# Patient Record
Sex: Female | Born: 2000 | Race: Black or African American | Hispanic: No | Marital: Single | Smoking: Never smoker
Health system: Southern US, Community
[De-identification: ages and names within clinical notes are randomized; demographics above are authoritative.]

## PROBLEM LIST (undated history)

## (undated) DIAGNOSIS — J45909 Unspecified asthma, uncomplicated: Secondary | ICD-10-CM

---
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
## 2019-08-07 ENCOUNTER — Other Ambulatory Visit: Payer: Self-pay

## 2019-08-07 DIAGNOSIS — Z20822 Contact with and (suspected) exposure to covid-19: Secondary | ICD-10-CM

## 2019-08-09 LAB — NOVEL CORONAVIRUS, NAA: SARS-CoV-2, NAA: NOT DETECTED

## 2019-10-18 ENCOUNTER — Other Ambulatory Visit: Payer: Self-pay

## 2019-10-18 ENCOUNTER — Emergency Department (HOSPITAL_BASED_OUTPATIENT_CLINIC_OR_DEPARTMENT_OTHER)
Admission: EM | Admit: 2019-10-18 | Discharge: 2019-10-19 | Disposition: A | Discharge status: Home / Self Care | Payer: BLUE CROSS/BLUE SHIELD | Attending: Emergency Medicine | Admitting: Emergency Medicine

## 2019-10-18 ENCOUNTER — Encounter (HOSPITAL_BASED_OUTPATIENT_CLINIC_OR_DEPARTMENT_OTHER): Payer: Self-pay

## 2019-10-18 DIAGNOSIS — J45909 Unspecified asthma, uncomplicated: Secondary | ICD-10-CM | POA: Insufficient documentation

## 2019-10-18 DIAGNOSIS — R1030 Lower abdominal pain, unspecified: Secondary | ICD-10-CM | POA: Diagnosis present

## 2019-10-18 DIAGNOSIS — K529 Noninfective gastroenteritis and colitis, unspecified: Secondary | ICD-10-CM

## 2019-10-18 MED ORDER — SODIUM CHLORIDE 0.9 % IV BOLUS
1000.0000 mL | Freq: Once | INTRAVENOUS | Status: AC
  Administered 2019-10-19: 01:00:00 1000 mL via INTRAVENOUS

## 2019-10-18 NOTE — ED Provider Notes (Addendum)
[facility] Provider Note: Lowella Dell, MD, FACEP  CSN: [id number] MRN: [id number] ARRIVAL: 10/18/19 at 2230 ROOM: [facility]04/[facility]04   CHIEF COMPLAINT  Abdominal Pain   HISTORY OF PRESENT ILLNESS  10/18/19 11:40 PM Alyssa Bauer is a 19 y.o. female who developed diarrhea, nausea and vomiting about 4 AM today.  She states she had multiple episodes of vomiting.  The diarrhea was associated with lower abdominal cramping which she rates as an 8 out of 10.  The pain has been intermittent and is relieved by voiding her bowels.  She is also having pain in her lower back.  She describes this pain as aching and rates it a 7.  She has not had a fever.  She was noted to be tachycardic on arrival.   Past Medical History:  Diagnosis Date  . Asthma     History reviewed. No pertinent surgical history.  No family history on file.  Social History   Tobacco Use  . Smoking status: Never Smoker  . Smokeless tobacco: Never Used  Substance Use Topics  . Alcohol use: Not Currently  . Drug use: Never    Prior to Admission medications   Medication Sig Start Date End Date Taking? Authorizing Provider  albuterol (PROVENTIL HFA;VENTOLIN HFA) 108 (90 BASE) MCG/ACT inhaler Inhale 2 puffs into the lungs every 4 (four) hours as needed for wheezing or shortness of breath.    [provider]  ondansetron (ZOFRAN ODT) 8 MG disintegrating tablet Take 1 tablet (8 mg total) by mouth every 8 (eight) hours as needed for nausea or vomiting. 10/19/19   Quetzali Heinle, Jonny Ruiz, MD    Allergies Patient has no known allergies.   REVIEW OF SYSTEMS  Negative except as noted here or in the History of Present Illness.   PHYSICAL EXAMINATION  Initial Vital Signs Blood pressure 122/90, pulse (!) 108, temperature 98.3 F (36.8 C), temperature source Oral, resp. rate 18, height 5\' 5"  (1.651 m), weight 63 kg, SpO2 100 %.  Examination General: Well-developed, well-nourished female in no acute distress;  appearance consistent with age of record HENT: normocephalic; atraumatic Eyes: Normal appearance Neck: supple Heart: regular rate and rhythm Lungs: clear to auscultation bilaterally Abdomen: soft; nondistended; nontender; no masses or hepatosplenomegaly; bowel sounds present Extremities: No deformity; full range of motion; pulses normal Neurologic: Awake, alert and oriented; motor function intact in all extremities and symmetric; no facial droop Skin: Warm and dry Psychiatric: Normal mood and affect   RESULTS  Summary of this visit's results, reviewed and interpreted by myself:   EKG Interpretation  Date/Time:    Ventricular Rate:    PR Interval:    QRS Duration:   QT Interval:    QTC Calculation:   R Axis:     Text Interpretation:        Laboratory Studies: Results for orders placed or performed during the hospital encounter of 10/18/19 (from the past 24 hour(s))  Urinalysis, Routine w reflex microscopic     Status: Abnormal   Collection Time: 10/19/19 12:21 AM  Result Value Ref Range   Color, Urine YELLOW YELLOW   APPearance CLEAR CLEAR   Specific Gravity, Urine 1.015 1.005 - 1.030   pH 6.0 5.0 - 8.0   Glucose, UA NEGATIVE NEGATIVE mg/dL   Hgb urine dipstick LARGE (A) NEGATIVE   Bilirubin Urine NEGATIVE NEGATIVE   Ketones, ur 40 (A) NEGATIVE mg/dL   Protein, ur NEGATIVE NEGATIVE mg/dL   Nitrite NEGATIVE NEGATIVE   Leukocytes,Ua NEGATIVE NEGATIVE  Pregnancy, urine     Status: None   Collection Time: 10/19/19 12:21 AM  Result Value Ref Range   Preg Test, Ur NEGATIVE NEGATIVE  Urinalysis, Microscopic (reflex)     Status: Abnormal   Collection Time: 10/19/19 12:21 AM  Result Value Ref Range   RBC / HPF 11-20 0 - 5 RBC/hpf   WBC, UA 0-5 0 - 5 WBC/hpf   Bacteria, UA FEW (A) NONE SEEN   Squamous Epithelial / LPF 0-5 0 - 5   Mucus PRESENT   CBC with Differential/Platelet     Status: Abnormal   Collection Time: 10/19/19 12:30 AM  Result Value Ref Range   WBC 11.2  (H) 4.0 - 10.5 K/uL   RBC 4.87 3.87 - 5.11 MIL/uL   Hemoglobin 13.8 12.0 - 15.0 g/dL   HCT 42.0 36.0 - 46.0 %   MCV 86.2 80.0 - 100.0 fL   MCH 28.3 26.0 - 34.0 pg   MCHC 32.9 30.0 - 36.0 g/dL   RDW 12.2 11.5 - 15.5 %   Platelets 254 150 - 400 K/uL   nRBC 0.0 0.0 - 0.2 %   Neutrophils Relative % 91 %   Neutro Abs 10.1 (H) 1.7 - 7.7 K/uL   Lymphocytes Relative 5 %   Lymphs Abs 0.6 (L) 0.7 - 4.0 K/uL   Monocytes Relative 4 %   Monocytes Absolute 0.5 0.1 - 1.0 K/uL   Eosinophils Relative 0 %   Eosinophils Absolute 0.0 0.0 - 0.5 K/uL   Basophils Relative 0 %   Basophils Absolute 0.0 0.0 - 0.1 K/uL   Immature Granulocytes 0 %   Abs Immature Granulocytes 0.03 0.00 - 0.07 K/uL  Comprehensive metabolic panel     Status: Abnormal   Collection Time: 10/19/19 12:30 AM  Result Value Ref Range   Sodium 137 135 - 145 mmol/L   Potassium 3.4 (L) 3.5 - 5.1 mmol/L   Chloride 101 98 - 111 mmol/L   CO2 25 22 - 32 mmol/L   Glucose, Bld 96 70 - 99 mg/dL   BUN 10 6 - 20 mg/dL   Creatinine, Ser 0.80 0.44 - 1.00 mg/dL   Calcium 9.3 8.9 - 10.3 mg/dL   Total Protein 8.0 6.5 - 8.1 g/dL   Albumin 4.6 3.5 - 5.0 g/dL   AST 18 15 - 41 U/L   ALT 12 0 - 44 U/L   Alkaline Phosphatase 52 38 - 126 U/L   Total Bilirubin 1.1 0.3 - 1.2 mg/dL   GFR calc non Af Amer >60 >60 mL/min   GFR calc Af Amer >60 >60 mL/min   Anion gap 11 5 - 15   Imaging Studies: No results found.  ED COURSE and MDM  Nursing notes, initial and subsequent vitals signs, including pulse oximetry, reviewed and interpreted by myself.  Vitals:   10/18/19 2244 10/18/19 2245 10/18/19 2330  BP:  122/90 106/81  Pulse:  (!) 108 94  Resp:  18 17  Temp:  98.3 F (36.8 C)   TempSrc:  Oral   SpO2:  100% 100%  Weight: 63 kg    Height: 5\' 5"  (1.651 m)     Medications  loperamide (IMODIUM) capsule 4 mg (has no administration in time range)  ondansetron (ZOFRAN) injection 4 mg (has no administration in time range)  sodium chloride 0.9 %  bolus 1,000 mL (1,000 mLs Intravenous New Bag/Given 10/19/19 0034)   1:09 AM Patient feeling better after IV fluids and Zofran.  Symptoms consistent  with a gastroenteritis.  Laboratory values are reassuring.   PROCEDURES  Procedures   ED DIAGNOSES     ICD-10-CM   1. Gastroenteritis  K52.9        Shuan Statzer, MD 10/19/19 0111    Paula Libra, MD 10/19/19 [phone]

## 2019-10-18 NOTE — ED Triage Notes (Signed)
Pt c/o abd pain x 1 week-v/d started yesterday-lower back pain x today-NAD-steady gait

## 2019-10-19 LAB — CBC WITH DIFFERENTIAL/PLATELET
Abs Immature Granulocytes: 0.03 10*3/uL (ref 0.00–0.07)
Basophils Absolute: 0 10*3/uL (ref 0.0–0.1)
Basophils Relative: 0 %
Eosinophils Absolute: 0 10*3/uL (ref 0.0–0.5)
Eosinophils Relative: 0 %
HCT: 42 % (ref 36.0–46.0)
Hemoglobin: 13.8 g/dL (ref 12.0–15.0)
Immature Granulocytes: 0 %
Lymphocytes Relative: 5 %
Lymphs Abs: 0.6 10*3/uL — ABNORMAL LOW (ref 0.7–4.0)
MCH: 28.3 pg (ref 26.0–34.0)
MCHC: 32.9 g/dL (ref 30.0–36.0)
MCV: 86.2 fL (ref 80.0–100.0)
Monocytes Absolute: 0.5 10*3/uL (ref 0.1–1.0)
Monocytes Relative: 4 %
Neutro Abs: 10.1 10*3/uL — ABNORMAL HIGH (ref 1.7–7.7)
Neutrophils Relative %: 91 %
Platelets: 254 10*3/uL (ref 150–400)
RBC: 4.87 MIL/uL (ref 3.87–5.11)
RDW: 12.2 % (ref 11.5–15.5)
WBC: 11.2 10*3/uL — ABNORMAL HIGH (ref 4.0–10.5)
nRBC: 0 % (ref 0.0–0.2)

## 2019-10-19 LAB — COMPREHENSIVE METABOLIC PANEL
ALT: 12 U/L (ref 0–44)
AST: 18 U/L (ref 15–41)
Albumin: 4.6 g/dL (ref 3.5–5.0)
Alkaline Phosphatase: 52 U/L (ref 38–126)
Anion gap: 11 (ref 5–15)
BUN: 10 mg/dL (ref 6–20)
CO2: 25 mmol/L (ref 22–32)
Calcium: 9.3 mg/dL (ref 8.9–10.3)
Chloride: 101 mmol/L (ref 98–111)
Creatinine, Ser: 0.8 mg/dL (ref 0.44–1.00)
GFR calc Af Amer: >60 mL/min (ref >60)
GFR calc non Af Amer: >60 mL/min (ref >60)
Glucose, Bld: 96 mg/dL (ref 70–99)
Potassium: 3.4 mmol/L — ABNORMAL LOW (ref 3.5–5.1)
Sodium: 137 mmol/L (ref 135–145)
Total Bilirubin: 1.1 mg/dL (ref 0.3–1.2)
Total Protein: 8 g/dL (ref 6.5–8.1)

## 2019-10-19 LAB — URINALYSIS, ROUTINE W REFLEX MICROSCOPIC
Bilirubin Urine: NEGATIVE
Glucose, UA: NEGATIVE mg/dL
Ketones, ur: 40 mg/dL — AB
Leukocytes,Ua: NEGATIVE
Nitrite: NEGATIVE
Protein, ur: NEGATIVE mg/dL
Specific Gravity, Urine: 1.015 (ref 1.005–1.030)
pH: 6 (ref 5.0–8.0)

## 2019-10-19 LAB — URINALYSIS, MICROSCOPIC (REFLEX)

## 2019-10-19 LAB — PREGNANCY, URINE: Preg Test, Ur: NEGATIVE

## 2019-10-19 MED ORDER — ONDANSETRON 8 MG PO TBDP: 8.0000 mg | ORAL_TABLET | Freq: Three times a day (TID) | ORAL | 0 refills | Status: AC | PRN

## 2019-10-19 MED ORDER — LOPERAMIDE HCL 2 MG PO CAPS
4.0000 mg | ORAL_CAPSULE | Freq: Once | ORAL | Status: AC
  Administered 2019-10-19: 4 mg via ORAL
  Filled 2019-10-19: qty 2

## 2019-10-19 MED ORDER — ONDANSETRON HCL 4 MG/2ML IJ SOLN
4.0000 mg | Freq: Once | INTRAMUSCULAR | Status: AC
  Administered 2019-10-19: 4 mg via INTRAVENOUS
  Filled 2019-10-19: qty 2

## 2019-12-21 ENCOUNTER — Ambulatory Visit: Payer: BLUE CROSS/BLUE SHIELD | Attending: Family

## 2019-12-21 DIAGNOSIS — Z23 Encounter for immunization: Secondary | ICD-10-CM

## 2019-12-21 NOTE — Progress Notes (Signed)
   Covid-19 Vaccination Clinic  Name:  Alyssa Bauer    MRN: [id number] DOB: 2001/07/30  12/21/2019  [name] was observed post Covid-19 immunization for 15 minutes without incident. She was provided with Vaccine Information Sheet and instruction to access the V-Safe system.   [name] was instructed to call 911 with any severe reactions post vaccine: Marland Kitchen Difficulty breathing  . Swelling of face and throat  . A fast heartbeat  . A bad rash all over body  . Dizziness and weakness   Immunizations Administered    Name Date Dose VIS Date Route   Moderna COVID-19 Vaccine 12/21/2019  1:09 PM 0.5 mL 08/2019 Intramuscular   Manufacturer: Moderna   Lot: [id number]   NDC: 19379-024-09

## 2020-01-16 ENCOUNTER — Ambulatory Visit: Payer: BLUE CROSS/BLUE SHIELD | Attending: Family

## 2020-01-16 DIAGNOSIS — Z23 Encounter for immunization: Secondary | ICD-10-CM

## 2020-01-16 NOTE — Progress Notes (Signed)
   Covid-19 Vaccination Clinic  Name:  Alyssa Bauer    MRN: [id number] DOB: 17-Sep-2000  01/16/2020  Alyssa Bauer was observed post Covid-19 immunization for 15 minutes without incident. She was provided with Vaccine Information Sheet and instruction to access the V-Safe system.   Alyssa Bauer was instructed to call 911 with any severe reactions post vaccine: Marland Kitchen Difficulty breathing  . Swelling of face and throat  . A fast heartbeat  . A bad rash all over body  . Dizziness and weakness   Immunizations Administered    Name Date Dose VIS Date Route   Moderna COVID-19 Vaccine 01/16/2020  1:14 PM 0.5 mL 08/2019 Intramuscular   Manufacturer: Moderna   Lot: [id number]   NDC: 79150-569-79

## 2024-06-22 ENCOUNTER — Emergency Department (HOSPITAL_BASED_OUTPATIENT_CLINIC_OR_DEPARTMENT_OTHER)

## 2024-06-22 ENCOUNTER — Emergency Department (HOSPITAL_BASED_OUTPATIENT_CLINIC_OR_DEPARTMENT_OTHER)
Admission: EM | Admit: 2024-06-22 | Discharge: 2024-06-22 | Disposition: A | Discharge status: Home / Self Care | Attending: Emergency Medicine | Admitting: Emergency Medicine

## 2024-06-22 ENCOUNTER — Other Ambulatory Visit: Payer: Self-pay

## 2024-06-22 ENCOUNTER — Encounter (HOSPITAL_BASED_OUTPATIENT_CLINIC_OR_DEPARTMENT_OTHER): Payer: Self-pay | Admitting: Emergency Medicine

## 2024-06-22 DIAGNOSIS — M25531 Pain in right wrist: Secondary | ICD-10-CM | POA: Diagnosis present

## 2024-06-22 DIAGNOSIS — W010XXA Fall on same level from slipping, tripping and stumbling without subsequent striking against object, initial encounter: Secondary | ICD-10-CM | POA: Insufficient documentation

## 2024-06-22 MED ORDER — NAPROXEN 250 MG PO TABS
500.0000 mg | ORAL_TABLET | Freq: Once | ORAL | Status: AC
  Administered 2024-06-22: 500 mg via ORAL
  Filled 2024-06-22: qty 2

## 2024-06-22 MED ORDER — NAPROXEN 500 MG PO TABS: 500.0000 mg | ORAL_TABLET | Freq: Two times a day (BID) | ORAL | 0 refills | Status: AC

## 2024-06-22 NOTE — ED Triage Notes (Signed)
 Right wrist injury 1 hour ago , tripped and fell forward . Obvious swelling and deformity , no ROM .

## 2024-06-22 NOTE — Discharge Instructions (Addendum)
 Your x-ray did not show any fracture or dislocation.  You are prescribed a short course of anti-inflammatories, please take 1 tablet twice a day for the next 7 days.

## 2024-06-22 NOTE — ED Provider Notes (Signed)
 [facility] Provider Note   CSN: [id number] Arrival date & time: 06/22/24  1039     Patient presents with: Wrist Pain   Alyssa Bauer is a 23 y.o. female.   23 y.o female with no PMH presents to the ED with a chief complaint of right wrist pain for the past hour.  Patient reports he was taking out the trash carrying a trash bag with her left hand and tried to brace her fall after slipping with the right hand.  She is endorsing pain along the thenar area worsened with any type of movement and palpation.  She has not taken any medication for improvement in symptoms.  There is a small deformity noted.  She does have a lot of pain with any type of movement. Did not strike her head, no other complaints reported.   The history is provided by the patient.  Wrist Pain This is a new problem. The current episode started 3 to 5 hours ago. The problem occurs constantly. The problem has been gradually worsening. The symptoms are aggravated by bending. Nothing relieves the symptoms. She has tried nothing for the symptoms.       Prior to Admission medications   Medication Sig Start Date End Date Taking? Authorizing Provider  naproxen (NAPROSYN) 500 MG tablet Take 1 tablet (500 mg total) by mouth 2 (two) times daily for 7 days. 06/22/24 06/29/24 Yes Sandy Haye, PA-C  albuterol (PROVENTIL HFA;VENTOLIN HFA) 108 (90 BASE) MCG/ACT inhaler Inhale 2 puffs into the lungs every 4 (four) hours as needed for wheezing or shortness of breath.    [provider]  ondansetron  (ZOFRAN  ODT) 8 MG disintegrating tablet Take 1 tablet (8 mg total) by mouth every 8 (eight) hours as needed for nausea or vomiting. 10/19/19   Molpus, Norleen, MD    Allergies: Patient has no known allergies.    Review of Systems  Constitutional:  Negative for fever.  Musculoskeletal:  Positive for arthralgias.    Updated Vital Signs BP (!) 128/90 (BP Location: Left Arm)   Pulse 76    Temp 97.7 F (36.5 C)   Resp 16   Wt 59 kg   LMP 05/12/2024 (Approximate)   SpO2 100%   BMI 21.63 kg/m   Physical Exam Vitals and nursing note reviewed.  Constitutional:      Appearance: Normal appearance.  HENT:     Head: Normocephalic and atraumatic.     Mouth/Throat:     Mouth: Mucous membranes are moist.  Cardiovascular:     Rate and Rhythm: Normal rate.  Pulmonary:     Effort: Pulmonary effort is normal.     Breath sounds: Normal breath sounds. No wheezing.  Abdominal:     General: Abdomen is flat.     Palpations: Abdomen is soft.     Tenderness: There is no abdominal tenderness.  Musculoskeletal:     Right wrist: Deformity and tenderness present. No effusion, bony tenderness, snuff box tenderness or crepitus. Decreased range of motion. Normal pulse.     Cervical back: Normal range of motion and neck supple.     Comments: Ttp along the snuff box, worsening pain with passive ROM. 2+ radial pulse. Capillary refill is intact. Good sensation throughout.   Skin:    General: Skin is warm and dry.  Neurological:     Mental Status: She is alert and oriented to person, place, and time.     (all labs ordered are listed,  but only abnormal results are displayed) Labs Reviewed - No data to display  EKG: None  Radiology: DG Wrist Complete Right Result Date: 06/22/2024 CLINICAL DATA:  Fall, swelling, deformity EXAM: RIGHT WRIST - COMPLETE 3+ VIEW COMPARISON:  05/31/2013 FINDINGS: No acute bony abnormality. Specifically, no fracture, subluxation, or dislocation. Mild diffuse soft tissue swelling. Joint spaces maintained. IMPRESSION: No acute bony abnormality. Electronically Signed   By: Franky Crease M.D.   On: 06/22/2024 11:53     Procedures   Medications Ordered in the ED  naproxen (NAPROSYN) tablet 500 mg (500 mg Oral Given 06/22/24 1132)                                    Medical Decision Making Amount and/or Complexity of Data Reviewed Radiology:  ordered.  Risk Prescription drug management.     Patient presented to the ED with a chief complaint of right wrist pain status post mechanical fall.  Endorsing pain along the base of the right thumb exacerbated with any movement and palpation. Capillary refill is intact, 2+ radial pulses.  Noted for a small what appears to be a cyst versus deformity although this is soft in nature.  X-ray obtained did not show any fracture or dislocation.  We discussed treatment supportively with brace, anti-inflammatories for home.  She is agreeable to plan and treatment, will refer to hand surgery if symptoms not improved.  Hemodynamically stable for discharge.  Portions of this note were generated with Scientist, clinical (histocompatibility and immunogenetics). Dictation errors may occur despite best attempts at proofreading.   Final diagnoses:  Right wrist pain    ED Discharge Orders          Ordered    naproxen (NAPROSYN) 500 MG tablet  2 times daily        06/22/24 1200               Shane Melby, PA-C 06/22/24 1201    Rogelia Jerilynn RAMAN, MD 07/02/24 1715

## 2024-07-05 ENCOUNTER — Ambulatory Visit: Payer: Self-pay | Admitting: Family Medicine
